# Patient Record
Sex: Male | Born: 1985 | Race: White | Hispanic: No | Marital: Single
Health system: Midwestern US, Community
[De-identification: ages and names within clinical notes are randomized; demographics above are authoritative.]

## PROBLEM LIST (undated history)

## (undated) DIAGNOSIS — K219 Gastro-esophageal reflux disease without esophagitis: Secondary | ICD-10-CM

## (undated) DIAGNOSIS — J189 Pneumonia, unspecified organism: Secondary | ICD-10-CM

---
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
## 2017-11-23 ENCOUNTER — Encounter (HOSPITAL_COMMUNITY)
Admission: EM | Disposition: A | Discharge status: Home / Self Care | Payer: Self-pay | Source: Home / Self Care | Attending: Emergency Medicine

## 2017-11-23 ENCOUNTER — Emergency Department (HOSPITAL_COMMUNITY): Payer: Worker's Compensation

## 2017-11-23 ENCOUNTER — Ambulatory Visit (HOSPITAL_COMMUNITY)
Admission: EM | Admit: 2017-11-23 | Discharge: 2017-11-23 | Disposition: A | Discharge status: Home / Self Care | Payer: Worker's Compensation | Attending: Emergency Medicine | Admitting: Emergency Medicine

## 2017-11-23 ENCOUNTER — Ambulatory Visit: Admit: 2017-11-23 | Payer: Self-pay | Admitting: Orthopedic Surgery

## 2017-11-23 ENCOUNTER — Emergency Department (HOSPITAL_COMMUNITY): Payer: Worker's Compensation | Admitting: Anesthesiology

## 2017-11-23 ENCOUNTER — Encounter (HOSPITAL_COMMUNITY): Payer: Self-pay

## 2017-11-23 DIAGNOSIS — Z23 Encounter for immunization: Secondary | ICD-10-CM | POA: Insufficient documentation

## 2017-11-23 DIAGNOSIS — S62630B Displaced fracture of distal phalanx of right index finger, initial encounter for open fracture: Secondary | ICD-10-CM | POA: Insufficient documentation

## 2017-11-23 DIAGNOSIS — W230XXA Caught, crushed, jammed, or pinched between moving objects, initial encounter: Secondary | ICD-10-CM | POA: Insufficient documentation

## 2017-11-23 DIAGNOSIS — S62639B Displaced fracture of distal phalanx of unspecified finger, initial encounter for open fracture: Secondary | ICD-10-CM | POA: Diagnosis present

## 2017-11-23 DIAGNOSIS — S67190A Crushing injury of right index finger, initial encounter: Secondary | ICD-10-CM | POA: Insufficient documentation

## 2017-11-23 DIAGNOSIS — Y990 Civilian activity done for income or pay: Secondary | ICD-10-CM | POA: Insufficient documentation

## 2017-11-23 DIAGNOSIS — Z79899 Other long term (current) drug therapy: Secondary | ICD-10-CM | POA: Insufficient documentation

## 2017-11-23 DIAGNOSIS — Y9289 Other specified places as the place of occurrence of the external cause: Secondary | ICD-10-CM | POA: Insufficient documentation

## 2017-11-23 DIAGNOSIS — Z882 Allergy status to sulfonamides status: Secondary | ICD-10-CM | POA: Insufficient documentation

## 2017-11-23 DIAGNOSIS — K219 Gastro-esophageal reflux disease without esophagitis: Secondary | ICD-10-CM | POA: Diagnosis not present

## 2017-11-23 HISTORY — PX: I & D EXTREMITY: SHX5045

## 2017-11-23 HISTORY — DX: Gastro-esophageal reflux disease without esophagitis: K21.9

## 2017-11-23 SURGERY — IRRIGATION AND DEBRIDEMENT EXTREMITY
Anesthesia: Monitor Anesthesia Care | Site: Hand | Laterality: Right

## 2017-11-23 MED ORDER — BUPIVACAINE HCL (PF) 0.25 % IJ SOLN
INTRAMUSCULAR | Status: DC | PRN
  Administered 2017-11-23: 8 mL

## 2017-11-23 MED ORDER — PROPOFOL 10 MG/ML IV BOLUS
INTRAVENOUS | Status: AC
  Filled 2017-11-23: qty 40

## 2017-11-23 MED ORDER — FENTANYL CITRATE (PF) 250 MCG/5ML IJ SOLN
INTRAMUSCULAR | Status: DC | PRN
  Administered 2017-11-23: 50 ug via INTRAVENOUS

## 2017-11-23 MED ORDER — CEFAZOLIN SODIUM-DEXTROSE 2-4 GM/100ML-% IV SOLN
2.0000 g | Freq: Once | INTRAVENOUS | Status: AC
  Administered 2017-11-23: 2 g via INTRAVENOUS
  Filled 2017-11-23: qty 100

## 2017-11-23 MED ORDER — OXYCODONE HCL 5 MG/5ML PO SOLN: 5.0000 mg | Freq: Once | ORAL | Status: DC | PRN

## 2017-11-23 MED ORDER — BUPIVACAINE HCL (PF) 0.25 % IJ SOLN
INTRAMUSCULAR | Status: AC
  Filled 2017-11-23: qty 10

## 2017-11-23 MED ORDER — SODIUM CHLORIDE 0.9 % IR SOLN
Status: DC | PRN
  Administered 2017-11-23: 1000 mL

## 2017-11-23 MED ORDER — LIDOCAINE HCL 2 % IJ SOLN
20.0000 mL | Freq: Once | INTRAMUSCULAR | Status: AC
  Administered 2017-11-23: 400 mg
  Filled 2017-11-23: qty 20

## 2017-11-23 MED ORDER — FENTANYL CITRATE (PF) 250 MCG/5ML IJ SOLN
INTRAMUSCULAR | Status: AC
  Filled 2017-11-23: qty 5

## 2017-11-23 MED ORDER — MIDAZOLAM HCL 5 MG/5ML IJ SOLN
INTRAMUSCULAR | Status: DC | PRN
  Administered 2017-11-23 (×2): 1 mg via INTRAVENOUS

## 2017-11-23 MED ORDER — SODIUM CHLORIDE 0.9 % IR SOLN
Status: DC | PRN
  Administered 2017-11-23: 3000 mL

## 2017-11-23 MED ORDER — TETANUS-DIPHTH-ACELL PERTUSSIS 5-2.5-18.5 LF-MCG/0.5 IM SUSP
0.5000 mL | Freq: Once | INTRAMUSCULAR | Status: AC
  Administered 2017-11-23: 0.5 mL via INTRAMUSCULAR
  Filled 2017-11-23: qty 0.5

## 2017-11-23 MED ORDER — LACTATED RINGERS IV SOLN
INTRAVENOUS | Status: DC | PRN
  Administered 2017-11-23: 21:00:00 via INTRAVENOUS

## 2017-11-23 MED ORDER — FENTANYL CITRATE (PF) 100 MCG/2ML IJ SOLN: 25.0000 ug | INTRAMUSCULAR | Status: DC | PRN

## 2017-11-23 MED ORDER — BUPIVACAINE HCL 0.5 % IJ SOLN
50.0000 mL | Freq: Once | INTRAMUSCULAR | Status: AC
  Administered 2017-11-23: 50 mL
  Filled 2017-11-23 (×2): qty 50

## 2017-11-23 MED ORDER — CEFAZOLIN SODIUM-DEXTROSE 1-4 GM/50ML-% IV SOLN
INTRAVENOUS | Status: DC | PRN
  Administered 2017-11-23: 1 g via INTRAVENOUS

## 2017-11-23 MED ORDER — SODIUM CHLORIDE 0.9 % IV SOLN: INTRAVENOUS | Status: DC | PRN

## 2017-11-23 MED ORDER — MIDAZOLAM HCL 2 MG/2ML IJ SOLN
INTRAMUSCULAR | Status: AC
  Filled 2017-11-23: qty 2

## 2017-11-23 MED ORDER — OXYCODONE HCL 5 MG PO TABS: 5.0000 mg | ORAL_TABLET | Freq: Once | ORAL | Status: DC | PRN

## 2017-11-23 SURGICAL SUPPLY — 47 items
BANDAGE ACE 4X5 VEL STRL LF (GAUZE/BANDAGES/DRESSINGS) IMPLANT
BNDG COHESIVE 1X5 TAN STRL LF (GAUZE/BANDAGES/DRESSINGS) ×6 IMPLANT
BNDG CONFORM 2 STRL LF (GAUZE/BANDAGES/DRESSINGS) IMPLANT
BNDG GAUZE ELAST 4 BULKY (GAUZE/BANDAGES/DRESSINGS) IMPLANT
CANISTER SUCTION WELLS/JOHNSON (MISCELLANEOUS) ×3 IMPLANT
CORDS BIPOLAR (ELECTRODE) ×3 IMPLANT
COVER SURGICAL LIGHT HANDLE (MISCELLANEOUS) ×3 IMPLANT
CUFF TOURNIQUET SINGLE 18IN (TOURNIQUET CUFF) ×3 IMPLANT
CUFF TOURNIQUET SINGLE 24IN (TOURNIQUET CUFF) IMPLANT
DECANTER SPIKE VIAL GLASS SM (MISCELLANEOUS) ×3 IMPLANT
DRSG ADAPTIC 3X8 NADH LF (GAUZE/BANDAGES/DRESSINGS) ×3 IMPLANT
DRSG EMULSION OIL 3X3 NADH (GAUZE/BANDAGES/DRESSINGS) ×3 IMPLANT
DRSG MEPITEL 4X7.2 (GAUZE/BANDAGES/DRESSINGS) ×3 IMPLANT
GAUZE SPONGE 4X4 12PLY STRL (GAUZE/BANDAGES/DRESSINGS) ×3 IMPLANT
GAUZE XEROFORM 1X8 LF (GAUZE/BANDAGES/DRESSINGS) ×3 IMPLANT
GLOVE BIOGEL M 8.0 STRL (GLOVE) IMPLANT
GLOVE SS BIOGEL STRL SZ 8 (GLOVE) ×2 IMPLANT
GLOVE SUPERSENSE BIOGEL SZ 8 (GLOVE) ×4
GOWN STRL REUS W/ TWL LRG LVL3 (GOWN DISPOSABLE) IMPLANT
GOWN STRL REUS W/ TWL XL LVL3 (GOWN DISPOSABLE) ×2 IMPLANT
GOWN STRL REUS W/TWL LRG LVL3 (GOWN DISPOSABLE)
GOWN STRL REUS W/TWL XL LVL3 (GOWN DISPOSABLE) ×4
KIT BASIN OR (CUSTOM PROCEDURE TRAY) ×3 IMPLANT
KIT TURNOVER KIT B (KITS) ×3 IMPLANT
MANIFOLD NEPTUNE II (INSTRUMENTS) IMPLANT
NEEDLE HYPO 25GX1X1/2 BEV (NEEDLE) IMPLANT
NS IRRIG 1000ML POUR BTL (IV SOLUTION) ×3 IMPLANT
PACK ORTHO EXTREMITY (CUSTOM PROCEDURE TRAY) ×3 IMPLANT
PAD ARMBOARD 7.5X6 YLW CONV (MISCELLANEOUS) ×3 IMPLANT
PAD CAST 4YDX4 CTTN HI CHSV (CAST SUPPLIES) IMPLANT
PADDING CAST COTTON 4X4 STRL (CAST SUPPLIES)
SCRUB BETADINE 4OZ XXX (MISCELLANEOUS) ×3 IMPLANT
SET CYSTO W/LG BORE CLAMP LF (SET/KITS/TRAYS/PACK) ×3 IMPLANT
SOL PREP POV-IOD 4OZ 10% (MISCELLANEOUS) ×3 IMPLANT
SPLINT FINGER 7.25 W/BULB ALUM (SOFTGOODS) ×3 IMPLANT
SPONGE LAP 4X18 RFD (DISPOSABLE) IMPLANT
SUT CHROMIC 4 0 P 3 18 (SUTURE) ×3 IMPLANT
SUT CHROMIC 5 0 P 3 (SUTURE) ×3 IMPLANT
SUT CHROMIC 6 0 PS 4 (SUTURE) ×3 IMPLANT
SWAB CULTURE ESWAB REG 1ML (MISCELLANEOUS) IMPLANT
SYR CONTROL 10ML LL (SYRINGE) IMPLANT
TOWEL OR 17X24 6PK STRL BLUE (TOWEL DISPOSABLE) ×3 IMPLANT
TOWEL OR 17X26 10 PK STRL BLUE (TOWEL DISPOSABLE) ×3 IMPLANT
TUBE CONNECTING 12'X1/4 (SUCTIONS) ×1
TUBE CONNECTING 12X1/4 (SUCTIONS) ×2 IMPLANT
WATER STERILE IRR 1000ML POUR (IV SOLUTION) IMPLANT
YANKAUER SUCT BULB TIP NO VENT (SUCTIONS) ×3 IMPLANT

## 2017-11-23 NOTE — Anesthesia Preprocedure Evaluation (Addendum)
Anesthesia Evaluation  Patient identified by MRN, date of birth, ID band Patient awake    Reviewed: Allergy & Precautions, NPO status , Patient's Chart, lab work & pertinent test results  History of Anesthesia Complications Negative for: history of anesthetic complications  Airway Mallampati: II  TM Distance: >3 FB Neck ROM: Full    Dental  (+) Teeth Intact   Pulmonary neg pulmonary ROS,    breath sounds clear to auscultation       Cardiovascular negative cardio ROS   Rhythm:Regular     Neuro/Psych negative neurological ROS  negative psych ROS   GI/Hepatic Neg liver ROS, GERD  Controlled,  Endo/Other  negative endocrine ROS  Renal/GU negative Renal ROS  negative genitourinary   Musculoskeletal Right index finger laceration   Abdominal   Peds negative pediatric ROS (+)  Hematology negative hematology ROS (+)   Anesthesia Other Findings   Reproductive/Obstetrics                             Anesthesia Physical Anesthesia Plan  ASA: I and emergent  Anesthesia Plan: MAC   Post-op Pain Management:    Induction:   PONV Risk Score and Plan: 1 and Treatment may vary due to age or medical condition  Airway Management Planned: Nasal Cannula  Additional Equipment:   Intra-op Plan:   Post-operative Plan:   Informed Consent: I have reviewed the patients History and Physical, chart, labs and discussed the procedure including the risks, benefits and alternatives for the proposed anesthesia with the patient or authorized representative who has indicated his/her understanding and acceptance.   Dental advisory given  Plan Discussed with: CRNA, Surgeon and Anesthesiologist  Anesthesia Plan Comments:        Anesthesia Quick Evaluation

## 2017-11-23 NOTE — ED Notes (Signed)
Called short stay regarding when patient may go for surgery - Dr. Amanda PeaGramig currently has two cases ahead of patient. EDP aware and updated patient on delay and plan of care.

## 2017-11-23 NOTE — ED Provider Notes (Signed)
[facility] EMERGENCY DEPARTMENT Provider Note   CSN: [id number] Arrival date & time: 11/23/17  1420     History   Chief Complaint Chief Complaint  Patient presents with  . Finger Injury    HPI Jeffrey Haas is a 32 y.o. male.  HPI   32 year old male presents today with complaints of finger injury.  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  Patient notes pain at the finger bleeding controlled with pressure.  He notes his last p.o. intake was around 11:30 AM, is uncertain if his tetanus is up-to-date.  Past Medical History:  Diagnosis Date  . GERD (gastroesophageal reflux disease)     There are no active problems to display for this patient.   History reviewed. No pertinent surgical history.      Home Medications    Prior to Admission medications   Medication Sig Start Date End Date Taking? Authorizing Provider  omeprazole (PRILOSEC) 20 MG capsule Take 20 mg by mouth daily.   Yes [provider]    Family History History reviewed. No pertinent family history.  Social History Social History   Tobacco Use  . Smoking status: Not on file  Substance Use Topics  . Alcohol use: Not on file  . Drug use: Not on file     Allergies   Sulfa antibiotics   Review of Systems Review of Systems  All other systems reviewed and are negative.   Physical Exam Updated Vital Signs BP 128/87 (BP Location: Right Arm)   Pulse 87   Temp 98.3 F (36.8 C) (Oral)   Resp 16   SpO2 99%   Physical Exam  Constitutional: He is oriented to person, place, and time. He appears well-developed and well-nourished.  HENT:  Head: Normocephalic and atraumatic.  Eyes: Pupils are equal, round, and reactive to light. Conjunctivae are normal. Right eye exhibits no discharge. Left eye exhibits no discharge. No scleral icterus.    Neck: Normal range of motion. No JVD present. No tracheal deviation present.  Pulmonary/Chest: Effort normal. No stridor.  Neurological: He is alert and oriented to person, place, and time. Coordination normal.  Psychiatric: He has a normal mood and affect. His behavior is normal. Judgment and thought content normal.  Nursing note and vitals reviewed.          ED Treatments / Results  Labs (all labs ordered are listed, but only abnormal results are displayed) Labs Reviewed - No data to display  EKG None  Radiology Dg Finger Index Right  Addendum Date: 11/23/2017   ADDENDUM REPORT: 11/23/2017 15:31 ADDENDUM: Per discussion with the technologist, patient had finger caught in a conveyor belt 1 hour prior to admission. Electronically Signed   By: Norva PavlovElizabeth  Brown M.D.   On: 11/23/2017 15:31   Result Date: 11/23/2017 CLINICAL DATA:  soft tissue disruption EXAM: RIGHT INDEX FINGER 2+V COMPARISON:  None. FINDINGS: There is an oblique fracture involving the tuft of the distal phalanx of the second digit. There is associated soft tissue defect, consistent with laceration. No radiopaque foreign bodies. IMPRESSION: Laceration associated with tuft fracture of the second digit. Electronically Signed: By: Norva PavlovElizabeth  Brown M.D. On: 11/23/2017 15:26    Procedures .Nerve Block Date/Time: 11/23/2017 7:53 PM Performed by: Eyvonne MechanicHedges, Parisha Beaulac, PA-C Authorized by: Eyvonne MechanicHedges, Narda Fundora, PA-C   Consent:    Consent obtained:  Verbal   Consent  given by:  Patient   Alternatives discussed:  No treatment and alternative treatment Indications:    Indications:  Pain relief Location:    Body area:  Upper extremity   Upper extremity nerve blocked: second finger    Laterality:  Right Procedure details (see MAR for exact dosages):    Block needle gauge:  25 G   Anesthetic injected:  Bupivacaine 0.5% w/o epi and lidocaine 2% w/o epi   Steroid injected:  None   Injection procedure:  Anatomic landmarks  identified, incremental injection and anatomic landmarks palpated Post-procedure details:    Outcome:  Anesthesia achieved   Patient tolerance of procedure:  Tolerated well, no immediate complications   (including critical care time)  Medications Ordered in ED Medications  0.9 %  sodium chloride infusion (has no administration in time range)  Tdap (BOOSTRIX) injection 0.5 mL (0.5 mLs Intramuscular Given 11/23/17 1529)  lidocaine (XYLOCAINE) 2 % (with pres) injection 400 mg (400 mg Infiltration Given by Other 11/23/17 1535)  bupivacaine (MARCAINE) 0.5 % (with pres) injection 50 mL (50 mLs Infiltration Given by Other 11/23/17 1600)  ceFAZolin (ANCEF) IVPB 2g/100 mL premix (0 g Intravenous Stopped 11/23/17 1610)     Initial Impression / Assessment and Plan / ED Course  I have reviewed the triage vital signs and the nursing notes.  Pertinent labs & imaging results that were available during my care of the patient were reviewed by me and considered in my medical decision making (see chart for details).     Labs:   Imaging: DG finger right   Consults: Orthopedics   Therapeutics: Ancef, TDAP  Discharge Meds:   Assessment/Plan: 32 year old male presents today with partial amputation of his distal finger. Orthopedics consulted with rec for OR.  Patient has distal fracture, wound was irrigated here, bandaged, pending or management with Dr. Amanda PeaGramig.    Final Clinical Impressions(s) / ED Diagnoses   Final diagnoses:  Open fracture of tuft of distal phalanx of finger    ED Discharge Orders    None       Rosalio LoudHedges, Kharisma Glasner, PA-C 11/23/17 1955    Virgina Norfolkuratolo, Adam, DO 11/23/17 2150

## 2017-11-23 NOTE — ED Triage Notes (Signed)
Pt presents for evaluation of R first finger injury. 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

## 2017-11-23 NOTE — Transfer of Care (Signed)
Immediate Anesthesia Transfer of Care Note  Patient: Jeffrey Haas  Procedure(s) Performed: IRRIGATION AND DEBRIDEMENT OF RIGHT  INDEX FINGER,REPAIR OF BONE, NAILBED, AND TISSUE (Right Hand)  Patient Location: PACU  Anesthesia Type:MAC  Level of Consciousness: awake, alert  and oriented  Airway & Oxygen Therapy: Patient Spontanous Breathing  Post-op Assessment: Report given to RN and Post -op Vital signs reviewed and stable  Post vital signs: Reviewed and stable  Last Vitals:  Vitals Value Taken Time  BP    Temp    Pulse 81 11/23/2017 10:05 PM  Resp 14 11/23/2017 10:05 PM  SpO2 97 % 11/23/2017 10:05 PM  Vitals shown include unvalidated device data.  Last Pain:  Vitals:   11/23/17 1946  TempSrc:   PainSc: 0-No pain         Complications: No apparent anesthesia complications

## 2017-11-23 NOTE — Discharge Instructions (Signed)

## 2017-11-23 NOTE — Op Note (Signed)
NAMDayton Martes: War[place], Mervil MEDICAL RECORD [id number] ACCOUNT [id number] DATE OF BIRTH:[birth year] FACILITY: [facility] LOCATION: [facility] PHYSICIAN:Kaysea Raya M. Careena Degraffenreid, MD  OPERATIVE REPORT  DATE OF PROCEDURE:  11/23/2017  PREOPERATIVE DIAGNOSES:  Right index finger crushing injury with laceration, nail bed and nail plate injury, open distal phalanx fracture, soft tissue disarray.  POSTOPERATIVE DIAGNOSES:  Right index finger crushing injury with laceration, nail bed and nail plate injury, open distal phalanx fracture, soft tissue disarray.  PROCEDURE: 1.  Irrigation and debridement, open fracture, distal phalanx.  This was an excisional debridement with curette, knife and scissor of an open fracture. 2.  Open treatment distal phalanx fracture, right index finger. 3.  Nail plate removal, right index finger. 4.  Nail bed repair, complex, right index finger. 5.  Multiple small lateral fold and skin repairs.  SURGEON:  Dominica SeverinWilliam Julie Paolini, MD  ASSISTANT:  None.  COMPLICATIONS:  None.  ANESTHESIA:  Local with light IV sedation keeping the patient awake, alert and oriented.  TOURNIQUET TIME:  Zero.  INDICATIONS:  A 32 year old male status post crush injury at work.  He presents with a significant injury to the index finger as noted above.  He understands risks and benefits of surgery and desires to proceed.  OPERATIVE PROCEDURE:  The patient was given light sedation and was kept awake for the procedure.  The finger was anesthetized with Sensorcaine.  The patient had a thorough scrub with Hibiclens scrub, followed by Betadine scrub and paint, followed by  securing a sterile field and calling timeout.  The operation commenced with irrigation and debridement of skin, subcutaneous tissue, nail plate, nail bed and open fracture tissue.  This was an excisional debridement with curette, knife and scissor  without difficulty.  Three to 4 L were placed in the wound.  Following this, we performed  additional nail plate removal.  Following this, I then performed open treatment of the distal phalanx fracture with setting technique.  I did not want to pin this due to the traumatization of the soft tissues and the vascularity distally which was tenuous.  Due to this, we set the bone  and then performed a lateral nail fold repair followed by repair of the nail bed with 5-0 and 6-0 chromic suture.  All sutures were absorbable, utilizing 6-0 and 5-0 chromic.  The lateral nail fold and volar tissue lacerations were repaired without  difficulty.  The patient tolerated this well.  Following this, Adaptic was placed under the eponychial fold, and Mepitel, Xeroform and a soft bandage followed by finger splint was applied.  He will be discharged home.  Keflex 500 mg 1 p.o. q.i.d. x10 days and OxyIR p.r.n. pain were written for.  I have given him my personal cell phone as he will [place], and I will be happy to coordinate postop  care efforts.  He needs to be seen in 10-14 days.  I have written out all these instructions, and I have discussed with him all issues.  These notes have been discussed and all questions addressed.  Should any problems arise, we will be immediately  available.  I would give him a fair prognosis.  The distal tip was tenuous, but there was no significant venous congestion that persisted and no darkened or necrotic regions despite the timeframe duration from injury.  Thus, I am cautiously optimistic.   All questions have been addressed.  LN/NUANCE  D:11/23/2017 T:11/23/2017 JOB:[id number]

## 2017-11-23 NOTE — ED Notes (Signed)
Patient transported to x-ray. ?

## 2017-11-23 NOTE — Op Note (Signed)
Right index P3 Fx with I&D and NBR  See dict #[id number]  Bonny Egger MD

## 2017-11-23 NOTE — Consult Note (Signed)
Reason for Consult:Right index finger injury Referring Physician: A Curatolo  Dayton MartesChristopher Constantine is an 32 y.o. male.  HPI: Jeffrey RossettiRyan got his hand caught in a conveyor at work. It sheared the tip of his right index finger nearly off. He came to the ED for evaluation. X-rays showed an open tuft fx as well as the laceration and hand surgery was consulted. He is RHD.  Past Medical History:  Diagnosis Date  . GERD (gastroesophageal reflux disease)     History reviewed. No pertinent surgical history.  No family history on file.  Social History:  has no tobacco, alcohol, and drug history on file.  Allergies: No Known Allergies  Medications: I have reviewed the patient's current medications.  No results found for this or any previous visit (from the past 48 hour(s)).  Dg Finger Index Right  Addendum Date: 11/23/2017   ADDENDUM REPORT: 11/23/2017 15:31 ADDENDUM: Per discussion with the technologist, patient had finger caught in a conveyor belt 1 hour prior to admission. Electronically Signed   By: Norva PavlovElizabeth  Brown M.D.   On: 11/23/2017 15:31   Result Date: 11/23/2017 CLINICAL DATA:  soft tissue disruption EXAM: RIGHT INDEX FINGER 2+V COMPARISON:  None. FINDINGS: There is an oblique fracture involving the tuft of the distal phalanx of the second digit. There is associated soft tissue defect, consistent with laceration. No radiopaque foreign bodies. IMPRESSION: Laceration associated with tuft fracture of the second digit. Electronically Signed: By: Norva PavlovElizabeth  Brown M.D. On: 11/23/2017 15:26    Review of Systems  Constitutional: Negative for weight loss.  HENT: Negative for ear discharge, ear pain, hearing loss and tinnitus.   Eyes: Negative for blurred vision, double vision, photophobia and pain.  Respiratory: Negative for cough, sputum production and shortness of breath.   Cardiovascular: Negative for chest pain.  Gastrointestinal: Negative for abdominal pain, nausea and vomiting.   Genitourinary: Negative for dysuria, flank pain, frequency and urgency.  Musculoskeletal: Positive for joint pain (Right index finger). Negative for back pain, falls, myalgias and neck pain.  Neurological: Negative for dizziness, tingling, sensory change, focal weakness, loss of consciousness and headaches.  Endo/Heme/Allergies: Does not bruise/bleed easily.  Psychiatric/Behavioral: Negative for depression, memory loss and substance abuse. The patient is not nervous/anxious.    Blood pressure (!) 148/94, pulse 95, temperature 98.6 F (37 C), temperature source Oral, resp. rate 16, SpO2 98 %. Physical Exam  Constitutional: He appears well-developed and well-nourished. No distress.  HENT:  Head: Normocephalic and atraumatic.  Eyes: Conjunctivae are normal. Right eye exhibits no discharge. Left eye exhibits no discharge. No scleral icterus.  Neck: Normal range of motion.  Cardiovascular: Normal rate and regular rhythm.  Respiratory: Effort normal. No respiratory distress.  Musculoskeletal:  Right shoulder, elbow, wrist, digits- partial amputation index finger tip, no instability, no blocks to motion  Sens  Ax/R/M/U intact  Mot   Ax/ R/ PIN/ M/ AIN/ U intact  Rad 2+  Neurological: He is alert.  Skin: Skin is warm and dry. He is not diaphoretic.  Psychiatric: He has a normal mood and affect. His behavior is normal.    Assessment/Plan: Right index finger partial amputation with open distal phalanx fx -- For repair in OR tonight with Dr. Amanda PeaGramig. NPO until then. Anticipate home after OR.    Freeman CaldronMichael J. Creedon Danielski, PA-C Orthopedic Surgery [phone] 11/23/2017, 3:40 PM

## 2017-11-23 NOTE — ED Notes (Signed)
PA-C at bedside 

## 2017-11-24 ENCOUNTER — Encounter (HOSPITAL_COMMUNITY): Payer: Self-pay | Admitting: Orthopedic Surgery

## 2017-11-29 NOTE — Anesthesia Postprocedure Evaluation (Signed)
Anesthesia Post Note  Patient: Raequan Vanschaick  Procedure(s) Performed: IRRIGATION AND DEBRIDEMENT OF RIGHT  INDEX FINGER,REPAIR OF BONE, NAILBED, AND TISSUE (Right Hand)     Patient location during evaluation: PACU Anesthesia Type: MAC Level of consciousness: awake and alert Pain management: pain level controlled Vital Signs Assessment: post-procedure vital signs reviewed and stable Respiratory status: spontaneous breathing, nonlabored ventilation, respiratory function stable and patient connected to nasal cannula oxygen Cardiovascular status: stable and blood pressure returned to baseline Postop Assessment: no apparent nausea or vomiting Anesthetic complications: no    Last Vitals:  Vitals:   11/23/17 2226 11/23/17 2230  BP:    Pulse: 94   Resp: (!) 22   Temp:  36.7 C  SpO2: 97%     Last Pain:  Vitals:   11/23/17 2230  TempSrc:   PainSc: 0-No pain                 Waverly Keileigh Vahey

## 2018-01-19 NOTE — Progress Notes (Signed)
Formatting of this note is different from the original.  CARE TEAM:  Patient Care Team:  Howell Pringle, MD as PCP - General (Family Medicine)    ASSESSMENT    ICD-10-CM   1. Open displaced fracture of distal phalanx of right index finger with routine healing, subsequent encounter S62.630D     Patient Active Problem List   Diagnosis   ? 'light-for-dates' infant with signs of fetal malnutrition     PLAN  Treatment Plan:     I advised the patient that the symptoms he is experiencing are typical for this stage post-injury, and should continue to improve over the next few weeks.  I advised the patient to practice ROM of the right index finger, however, he is to limit actual use/exercise of the injured site.  He may gradually wean him/herself off the splint in couple weeks.   He has completed his HT (see note).  I gave him a work note for full duty without limitation.    Medical issues or concerns that may affect successful treatment: no significant medical risk factors    Orders Placed This Encounter   ? X-ray finger right 2+ views (73140)   ? BMI >=25 PATIENT INSTRUCTIONS & EDUCATION   ? BP Elevated Patient Education & Instructions    Follow-up: Return if symptoms worsen or fail to improve.     HISTORY OF PRESENT ILLNESS  Chief Complaint: Follow-up of the Right Index Finger    Age: 32 y.o.  Sex: male   Occupation: [employer]  Hand-dominance: Hand-dominance: Right    History of present illness: Mr. Stgermaine returns today for follow up with right index finger fracture.  To recall, the patient had a work incident and sustained above injury on 11/30/2017.  The patient was seen by Dr. Celine Mans which he was placed with a finger splint and HT as fracture care.  He is here for reevaluation.    Review of Systems   01/19/2018    Constitutional: Unexplained: Negative  Genitourinary: Frequent Urination: Negative  HEENT: Vision Loss: Negative  Neurological: Memory Loss: Negative  Integumentary: Rash:  Negative  Cardiovascular: Palpatations: Negative  Hematologic: Bruises/Bleeds Easily: Negative  Gastrointestinal: Constipation: Negative  Immunological: Seasonal Allergies: Negative  Musculoskeletal: Joint Pain: Negative    OBJECTIVE  Constitutional:  No acute distress. Well nourished. Well developed. His body mass index is 26.61 kg/m.   Eyes:  Sclera are nonicteric.  Respiratory:  No labored breathing.  Cardiovascular:  No marked edema.  Skin:  No marked skin ulcers.  Lymphatic: Negative epitrochlear lymphadenopathy.  Neurological:  Awake and alert.  Psychiatric: Alert and oriented x3.  Musculoskeletal     This is a cooperative person in no acute distress, normal gait, normal mood and affect.  Skin is intact hand, wrist, and forearm bilaterally.  2+ radial and ulnar pulses are noted bilaterally.  Sensation at the tips of the fingers are intact bilaterally.  Good capillary refill.    Further examination of the right index finger demonstrates mild tenderness at the fracture site.  Some tingling of right index finger tip.   Is able to make full fist with some tightness of the right index finger.  Full ROM.    IMAGING / STUDIES   Order: XR FINGER 2+ VW RIGHT - Indication: Open displaced fracture of   distal phalanx of right index finger with routine healing, subsequent   encounter      X-ray Finger Right 2+ Views ([id number])    Result Date: 01/19/2018  Shielding: Shielded. PA, Lateral .     Impression: See note.      X-ray ordered and obtained in the office today demonstrate a healing distal phalange fracture of the right index finger in anatomic alignment.    Supervising physician: Dr. Tawana Scale.  Electronically signed by Tawana Scale, MD at 01/20/2018  4:47 PM EDT

## 2019-07-01 IMAGING — CR DG FINGER INDEX 2+V*R*
4 series · 4 of 4 positions shown · non-contrast
Comparison: None.

ADDENDUM:
Per discussion with the technologist, patient had finger caught in a
conveyor belt 1 hour prior to admission.
CLINICAL DATA: soft tissue disruption

EXAM:
RIGHT INDEX FINGER 2+V

[finger ap]
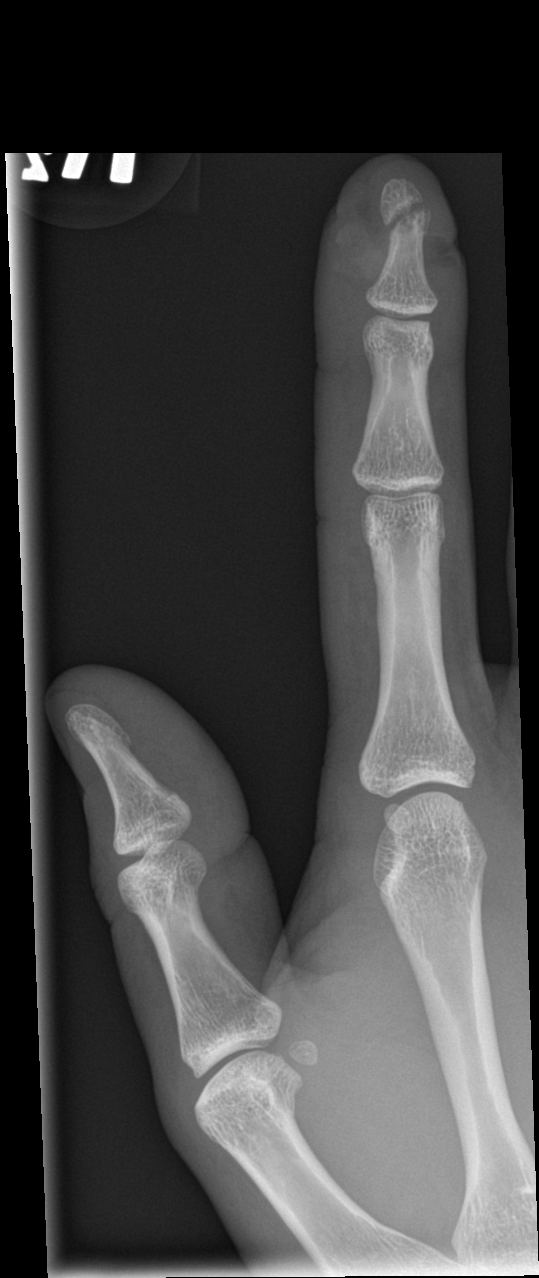

[finger obl (1 of 2)]
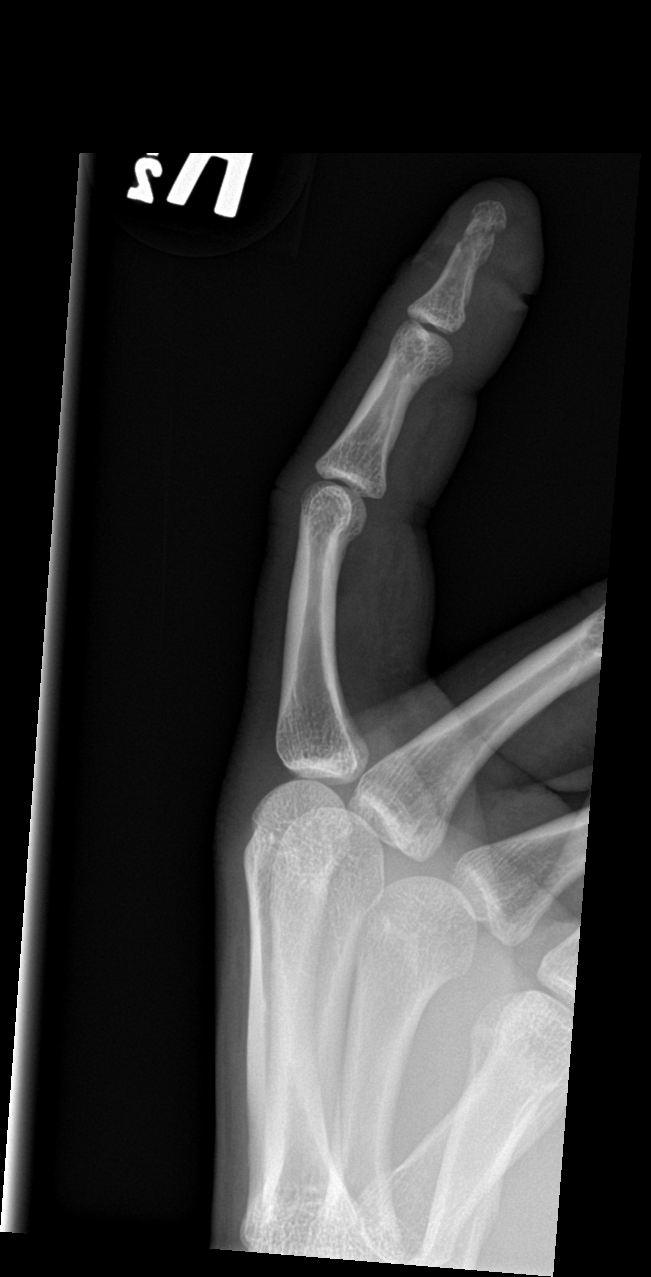

[finger lat]
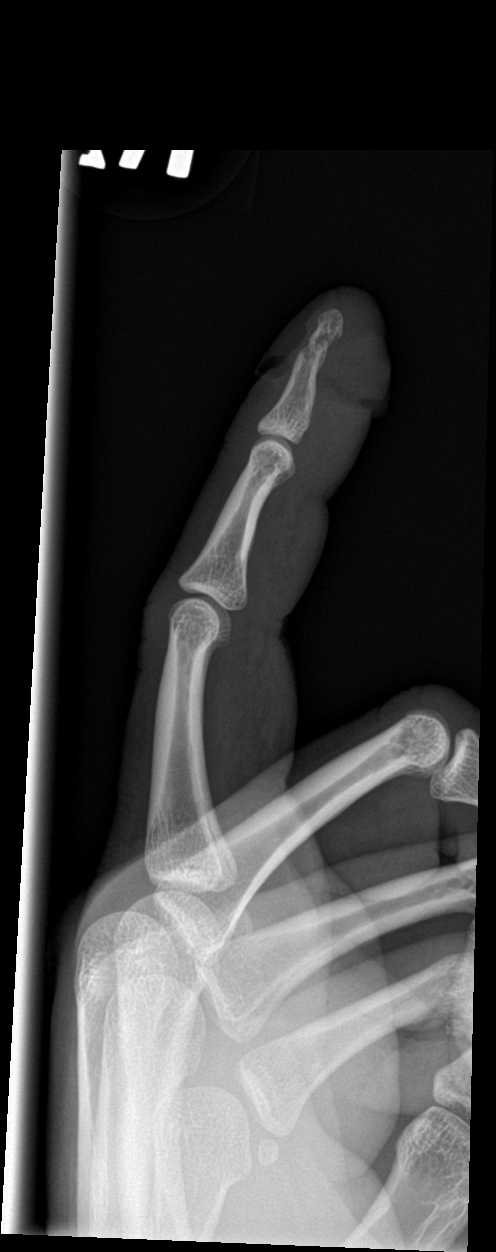

[finger obl (2 of 2)]
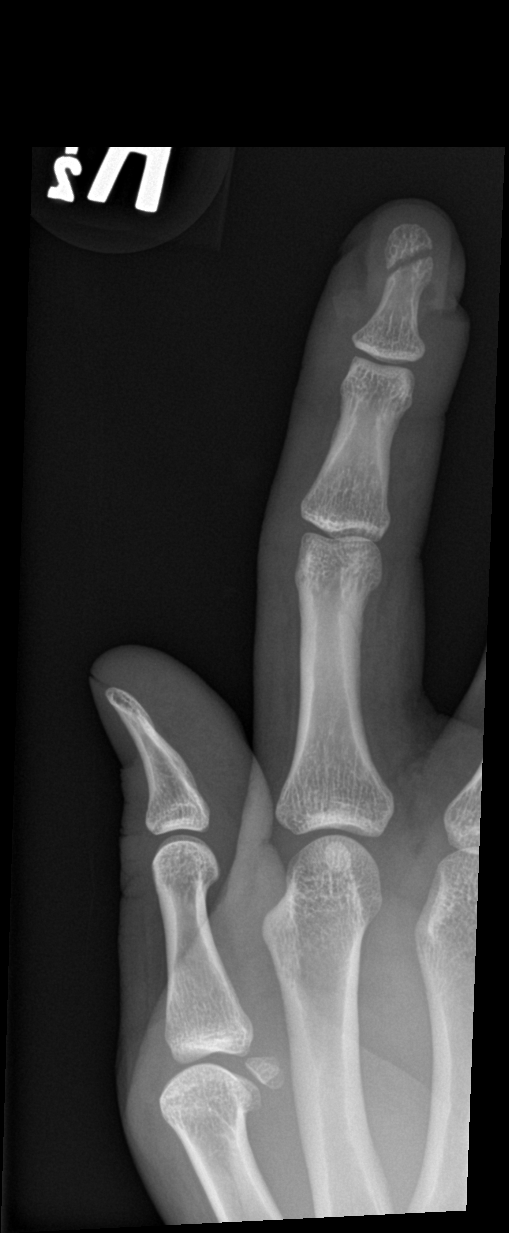

[4 of 4 positions shown; findings below may reference images not displayed]

FINDINGS: There is an oblique fracture involving the tuft of the distal
phalanx of the second digit. There is associated soft tissue defect,
consistent with laceration. No radiopaque foreign bodies.
IMPRESSION: Laceration associated with tuft fracture of the second digit.

## 2022-03-19 NOTE — Progress Notes (Signed)
Formatting of this note is different from the original.  Images from the original note were not included.    Subjective:    Patient ID: Luis Berger is a 36 y.o. male.    Chief Complaint   Patient presents with   ? Respiratory     HPI   5 of cough, HA, body aches, and congestion - he states he's hearing crackling in his chest when he lies down  Had a fever when illness began initially and isn't sure if he's running one or not but feels warm  Had a negative COVID test on day 2 of sx    Respiratory  Primary Symptom: Cough/Respiratory Complaint    Duration of current symtoms:  5 days  Onset quality:   suddenly    Associated symptoms: myalgias, chills, nasal congestion, fatigue, a fever and headaches    Associated symptoms: no appetite change, no ear pain, no rash, no rhinorrhea, no diaphoresis, no chest pain, no chest tightness, no nocturnal dyspnea, no postnasal drip, no shortness of breath, no sinus pain, no sneezing, no sore throat, no syncope, no unexpected weight change and no wheezing    Cough Characteristics: nonproductive and productive of sputum        Review of Systems   Constitutional: Positive for chills, fatigue and fever. Negative for appetite change, diaphoresis and unexpected weight change.   HENT: Positive for congestion. Negative for ear pain, postnasal drip, rhinorrhea, sinus pain, sneezing and sore throat.    Respiratory: Negative for chest tightness, shortness of breath and wheezing.    Cardiovascular: Negative for chest pain.   Musculoskeletal: Positive for myalgias.   Skin: Negative for rash.   Neurological: Positive for headaches. Negative for syncope.   All other systems reviewed and are negative.    Social History     Tobacco Use   Smoking Status Never   Smokeless Tobacco Never     Past Medical History:   Diagnosis Date   ? GERD (gastroesophageal reflux disease)      History reviewed. No pertinent surgical history.  History reviewed. No pertinent family  history.  Objective:        Physical Exam  Constitutional:       General: He is not in acute distress.     Appearance: Normal appearance. He is normal weight. He is not ill-appearing, toxic-appearing or diaphoretic.   HENT:      Head: Normocephalic and atraumatic.      Right Ear: Tympanic membrane, ear canal and external ear normal. There is no impacted cerumen.      Left Ear: Tympanic membrane, ear canal and external ear normal. There is no impacted cerumen.      Nose: Congestion present.      Right Sinus: No maxillary sinus tenderness or frontal sinus tenderness.      Left Sinus: No maxillary sinus tenderness or frontal sinus tenderness.      Mouth/Throat:      Mouth: Mucous membranes are moist.      Pharynx: Oropharynx is clear.   Eyes:      Conjunctiva/sclera: Conjunctivae normal.   Cardiovascular:      Rate and Rhythm: Normal rate and regular rhythm.      Pulses: Normal pulses.      Heart sounds: Normal heart sounds.   Pulmonary:      Effort: Pulmonary effort is normal.      Breath sounds: Examination of the right-upper field reveals rhonchi. Rhonchi present.     Musculoskeletal:  Cervical back: Normal range of motion and neck supple. No rigidity or tenderness.   Lymphadenopathy:      Cervical: No cervical adenopathy.   Skin:     General: Skin is warm and dry.   Neurological:      General: No focal deficit present.      Mental Status: He is alert and oriented to person, place, and time.   Psychiatric:         Mood and Affect: Mood normal.         Behavior: Behavior normal.           Assessment/Plan:    HPI provided by Self    Based on today's visit:history and physical exam only, as no relevant testing deemed necessary, history, physical exam and all relevant testing completed in clinic today and history, physical exam and all relevant testing previously completed  patient's visit diagnosis is/includes   1. Community acquired pneumonia of right upper lobe of lung    2. Acute cough      Patient does not have  a history of chronic conditions.    Treatment plan includes:   Orders Placed:  No orders of the defined types were placed in this encounter.    Medications ordered this visit     Signed Prescriptions Disp Refills   ? doxycycline (VIBRAMYCIN) 100 MG capsule 14 capsule 0     Sig: Take 1 capsule (100 mg total) by mouth 2 (two) times a day For 5 to 7 days   ? benzonatate (TESSALON) 100 MG capsule 30 capsule 0     Sig: Swallow whole one (100mg ) capsule by mouth 3 times a day  as needed.Do not break, chew, dissolve, cut or crush.   ? albuterol (VENTOLIN HFA) 90 mcg/actuation inhaler 1 g 0     Sig: Inhale 1-2 puffs Every 4-6 hours as needed for wheezing for up to 30 days     Current medication list and any new medications prescribed or recommended today were reviewed with the patient and specific instructions were provided Yes    Provider Recommendations    Follow up care instructions were provided and reviewed?with the  Patient. All questions were answered. Patient verbalized understanding of plan of care today.                        Clinical presentation significant for bacterial infection.  Antibiotics prescribed in accordance with [facility] guidelines.    Reviewed supportive care, expected course of illness, when and how to use the inhaler if needed, and s/s of worsening that would require going to the ED or urgent care.                      Electronically signed by , NP at 03/19/2022 11:41 AM EST

## 2022-05-04 ENCOUNTER — Encounter

## 2022-05-04 ENCOUNTER — Inpatient Hospital Stay: Admit: 2022-05-04 | Payer: PRIVATE HEALTH INSURANCE | Primary: Specialist

## 2022-05-04 DIAGNOSIS — J189 Pneumonia, unspecified organism: Secondary | ICD-10-CM

## 2023-01-12 ENCOUNTER — Emergency Department: Admit: 2023-01-13 | Payer: PRIVATE HEALTH INSURANCE | Primary: Specialist

## 2023-01-12 DIAGNOSIS — N201 Calculus of ureter: Secondary | ICD-10-CM

## 2023-01-12 DIAGNOSIS — N132 Hydronephrosis with renal and ureteral calculous obstruction: Secondary | ICD-10-CM

## 2023-01-12 NOTE — ED Provider Notes (Shared)
[facility] EMERGENCY DEPT  EMERGENCY DEPARTMENT ENCOUNTER      Pt Name: Luis Berger  MRN: [id number]  Birthdate 1985/09/28  Date of evaluation: 01/12/2023  Provider: Ellsworth Lennox, DO    CHIEF COMPLAINT       Chief Complaint   Patient presents with    Nausea    Emesis         HISTORY OF PRESENT ILLNESS    HPI    Luis Berger is a 37 y.o. male with a history of GERD who presents to the emergency department for evaluation of abdominal pain, flank pain, nausea and vomiting.  Patient reports yesterday he began feeling generally unwell, children at home have influenza and he thought he was coming down with this.  He has had some lower abdominal pain and low back discomfort, this evening though he had sudden onset worsening right-sided flank pain with radiation to the lower abdomen with associated nausea and vomiting.  States pain has been waxing and waning and when it is severe he gets nauseous and throws up.  He has not had any fever.  No urinary symptoms.  Denies personal history of kidney stones.    Nursing Notes were reviewed.    REVIEW OF SYSTEMS       Review of Systems   Constitutional:  Negative for chills and fever.   Eyes:  Negative for discharge and redness.   Gastrointestinal:  Positive for abdominal pain, nausea and vomiting. Negative for diarrhea.   Genitourinary:  Positive for flank pain. Negative for dysuria and hematuria.   Neurological:  Negative for speech difficulty.   Psychiatric/Behavioral:  Negative for agitation.            PAST MEDICAL HISTORY   History reviewed. No pertinent past medical history.      SURGICAL HISTORY     History reviewed. No pertinent surgical history.      CURRENT MEDICATIONS       Previous Medications    No medications on file       ALLERGIES     Sulfa antibiotics    FAMILY HISTORY     History reviewed. No pertinent family history.       SOCIAL HISTORY       Social History     Socioeconomic History    Marital status: Single     Spouse name: None    Number  of children: None    Years of education: None    Highest education level: None           PHYSICAL EXAM       ED Triage Vitals   BP Systolic BP Percentile Diastolic BP Percentile Temp Temp Source Pulse Respirations SpO2   01/12/23 2315 -- -- 01/12/23 2316 01/12/23 2316 01/12/23 2316 01/12/23 2316 01/12/23 2315   (!) 148/98   97.7 F (36.5 C) Oral 94 20 98 %      Height Weight - Scale         01/12/23 2316 01/12/23 2316         1.727 m (5\' 8" ) 79.4 kg (175 lb)             Body mass index is 26.61 kg/m.    Physical Exam  Vitals and nursing note reviewed.   Constitutional:       General: He is in acute distress.      Appearance: Normal appearance. He is not ill-appearing or toxic-appearing.   HENT:  Head: Normocephalic and atraumatic.   Eyes:      General: No scleral icterus.        Right eye: No discharge.         Left eye: No discharge.      Conjunctiva/sclera: Conjunctivae normal.   Cardiovascular:      Rate and Rhythm: Normal rate.   Pulmonary:      Effort: Pulmonary effort is normal. No respiratory distress.   Abdominal:      Tenderness: There is no abdominal tenderness. There is right CVA tenderness. There is no left CVA tenderness, guarding or rebound.   Musculoskeletal:         General: Normal range of motion.      Cervical back: Normal range of motion.   Skin:     General: Skin is warm and dry.      Capillary Refill: Capillary refill takes less than 2 seconds.   Neurological:      General: No focal deficit present.      Mental Status: He is alert.   Psychiatric:         Mood and Affect: Mood normal.         Behavior: Behavior normal.         DIAGNOSTIC RESULTS     EKG: All EKG's are interpreted by the Emergency Department Physician who either signs or Co-signs this chart in the absence of a cardiologist.         RADIOLOGY:   Non-plain film images such as CT, Ultrasound and MRI are read by the radiologist. Plain radiographic images are visualized and preliminarily interpreted by the emergency physician  with the below findings:        Interpretation per the Radiologist below, if available at the time of this note:    CT ABDOMEN PELVIS WO CONTRAST Additional Contrast? None    (Results Pending)        LABS:  Labs Reviewed   CBC WITH AUTO DIFFERENTIAL - Abnormal; Notable for the following components:       Result Value    Neutrophils % 78 (*)     Neutrophils Absolute 8.6 (*)     All other components within normal limits   EXTRA TUBES HOLD   BASIC METABOLIC PANEL   URINALYSIS WITH REFLEX TO CULTURE       All other labs were within normal range or not returned as of this dictation.    EMERGENCY DEPARTMENT COURSE and DIFFERENTIAL DIAGNOSIS/MDM:   Vitals:    Vitals:    01/12/23 2315 01/12/23 2316   BP: (!) 148/98 (!) 149/98   Pulse:  94   Resp:  20   Temp:  97.7 F (36.5 C)   TempSrc:  Oral   SpO2: 98% 98%   Weight:  79.4 kg (175 lb)   Height:  1.727 m (5\' 8" )           Medical Decision Making  Amount and/or Complexity of Data Reviewed  Labs: ordered.  Radiology: ordered.    Risk  Prescription drug management.        CONSULTS:  None    REASSESSMENT       **The patient has been re-evaluated and feeling much better and are stable for discharge**.  All available radiology and laboratory results have been reviewed with patient and/or available family.  Patient and/or family verbally conveyed their understanding and agreement of the patient's signs, symptoms, diagnosis, treatment and prognosis and additionally agree to follow-up as recommended  in the discharge instructions or to return to the Emergency Department should their condition change or worsen prior to their follow-up appointment.  All questions have been answered and patient and/or available family express understanding.        PROCEDURES:  Unless otherwise noted below, none     Procedures      FINAL IMPRESSION    No diagnosis found.      DISPOSITION/PLAN   DISPOSITION        PATIENT REFERRED TO:  No follow-up provider specified.    DISCHARGE MEDICATIONS:  New  Prescriptions    No medications on file         (Please note that portions of this note were completed with a voice recognition program.  Efforts were made to edit the dictations but occasionally words are mis-transcribed.)    Ellsworth Lennox, DO (electronically signed)  Emergency Attending Physician / Physician Assistant / Nurse Practitioner

## 2023-01-12 NOTE — ED Triage Notes (Signed)
Pt complaining of sudden onset of severe right flank pain that started tonight. Wife sts patient has vomited about 4 times. Pt pale, diaphoretic and holding right side. 10/10 pain. Pt sts family hx of kidney stones.

## 2023-01-13 ENCOUNTER — Inpatient Hospital Stay
Admit: 2023-01-13 | Discharge: 2023-01-13 | Disposition: A | Discharge status: Home / Self Care | Payer: PRIVATE HEALTH INSURANCE | Attending: Emergency Medicine

## 2023-01-13 LAB — CBC WITH AUTO DIFFERENTIAL
Basophils %: 0 % (ref 0–1)
Basophils Absolute: 0 10*3/uL (ref 0.0–0.1)
Eosinophils %: 2 % (ref 0–7)
Eosinophils Absolute: 0.2 10*3/uL (ref 0.0–0.4)
Hematocrit: 42.5 % (ref 36.6–50.3)
Hemoglobin: 15.4 g/dL (ref 12.1–17.0)
Immature Granulocytes %: 0 % (ref 0–0.5)
Immature Granulocytes Absolute: 0 10*3/uL (ref 0.00–0.04)
Lymphocytes %: 14 % (ref 12–49)
Lymphocytes Absolute: 1.5 10*3/uL (ref 0.8–3.5)
MCH: 30 pg (ref 26.0–34.0)
MCHC: 36.2 g/dL (ref 30.0–36.5)
MCV: 82.8 FL (ref 80.0–99.0)
MPV: 10.3 FL (ref 8.9–12.9)
Monocytes %: 6 % (ref 5–13)
Monocytes Absolute: 0.7 10*3/uL (ref 0.0–1.0)
Neutrophils %: 78 % — ABNORMAL HIGH (ref 32–75)
Neutrophils Absolute: 8.6 10*3/uL — ABNORMAL HIGH (ref 1.8–8.0)
Nucleated RBCs: 0 /100{WBCs}
Platelets: 289 10*3/uL (ref 150–400)
RBC: 5.13 M/uL (ref 4.10–5.70)
RDW: 12.2 % (ref 11.5–14.5)
WBC: 11.1 10*3/uL (ref 4.1–11.1)
nRBC: 0 10*3/uL (ref 0.00–0.01)

## 2023-01-13 LAB — BASIC METABOLIC PANEL
Anion Gap: 11 mmol/L (ref 2–12)
BUN/Creatinine Ratio: 15 (ref 12–20)
BUN: 18 mg/dL (ref 6–20)
CO2: 25 mmol/L (ref 21–32)
Calcium: 9.3 mg/dL (ref 8.5–10.1)
Chloride: 103 mmol/L (ref 97–108)
Creatinine: 1.19 mg/dL (ref 0.70–1.30)
Est, Glom Filt Rate: 80 mL/min/{1.73_m2} (ref >60)
Glucose: 151 mg/dL — ABNORMAL HIGH (ref 65–100)
Potassium: 3.9 mmol/L (ref 3.5–5.1)
Sodium: 139 mmol/L (ref 136–145)

## 2023-01-13 LAB — URINALYSIS WITH REFLEX TO CULTURE
BACTERIA, URINE: NEGATIVE /HPF
Bilirubin, Urine: NEGATIVE
Glucose, Ur: NEGATIVE mg/dL
Ketones, Urine: 15 mg/dL — AB
Leukocyte Esterase, Urine: NEGATIVE
Nitrite, Urine: NEGATIVE
Protein, UA: 30 mg/dL — AB
RBC, UA: >100 /HPF — ABNORMAL HIGH (ref 0–5)
Specific Gravity, UA: >1.03 — ABNORMAL HIGH (ref 1.003–1.030)
Urobilinogen, Urine: 0.2 EU/dL (ref 0.2–1.0)
pH, Urine: 6.5 (ref 5.0–8.0)

## 2023-01-13 MED ORDER — KETOROLAC TROMETHAMINE 30 MG/ML IJ SOLN
30 | Freq: Once | INTRAMUSCULAR | Status: AC
Start: 2023-01-13 — End: 2023-01-12
  Administered 2023-01-13: 03:00:00 15 mg via INTRAVENOUS

## 2023-01-13 MED ORDER — ONDANSETRON HCL 4 MG/2ML IJ SOLN
4 | INTRAMUSCULAR | Status: AC
Start: 2023-01-13 — End: 2023-01-12
  Administered 2023-01-13: 04:00:00 4 mg via INTRAVENOUS

## 2023-01-13 MED ORDER — ONDANSETRON HCL 4 MG PO TABS
4 | ORAL_TABLET | Freq: Three times a day (TID) | ORAL | 0 refills | Status: AC | PRN
Start: 2023-01-13 — End: ?

## 2023-01-13 MED ORDER — TAMSULOSIN HCL 0.4 MG PO CAPS
0.4 | ORAL_CAPSULE | Freq: Every day | ORAL | 0 refills | Status: AC
Start: 2023-01-13 — End: ?

## 2023-01-13 MED ORDER — MORPHINE SULFATE (PF) 4 MG/ML IJ SOLN
4 | INTRAMUSCULAR | Status: AC
Start: 2023-01-13 — End: 2023-01-13
  Administered 2023-01-13: 06:00:00 4 mg via INTRAVENOUS

## 2023-01-13 MED ORDER — KETOROLAC TROMETHAMINE 30 MG/ML IJ SOLN
30 | INTRAMUSCULAR | Status: DC
Start: 2023-01-13 — End: 2023-01-12

## 2023-01-13 MED ORDER — KETOROLAC TROMETHAMINE 10 MG PO TABS
10 | ORAL_TABLET | Freq: Four times a day (QID) | ORAL | 0 refills | Status: AC | PRN
Start: 2023-01-13 — End: ?

## 2023-01-13 MED ORDER — OXYCODONE HCL 5 MG PO TABS
5 | ORAL_TABLET | Freq: Four times a day (QID) | ORAL | 0 refills | Status: AC | PRN
Start: 2023-01-13 — End: 2023-01-16

## 2023-01-13 MED FILL — MORPHINE SULFATE 4 MG/ML IJ SOLN: 4 mg/mL | INTRAMUSCULAR | Qty: 1

## 2023-01-13 MED FILL — KETOROLAC TROMETHAMINE 30 MG/ML IJ SOLN: 30 MG/ML | INTRAMUSCULAR | Qty: 1

## 2023-01-13 MED FILL — ONDANSETRON HCL 4 MG/2ML IJ SOLN: 4 MG/2ML | INTRAMUSCULAR | Qty: 2

## 2023-01-13 NOTE — Discharge Instructions (Signed)
Return to the ER for any new or worsening symptoms, if you develop fever, vomiting and cannot keep fluids down, worsening pain not controlled by pain medications or for any other concerns.

## 2023-01-13 NOTE — ED Notes (Signed)
The patient was discharged home by Dr. Sandi Mariscal and Sheralyn Boatman, rn in stable condition, accompanied by family. The patient is alert and oriented, is in no respiratory distress and has vital signs within normal limits . The patient's diagnosis, condition and treatment were explained to patient. The patient expressed understanding. No prescriptions given to pt. No work/school note given to pt. A discharge plan has been developed. A case manager was not involved in the process. Aftercare instructions were given to the patient. Pt's saline lock removed without complications.
